# Patient Record
Sex: Female | Born: 1980 | Race: White | Hispanic: No | Marital: Married | Smoking: Current every day smoker
Health system: Southern US, Community
[De-identification: ages and names within clinical notes are randomized; demographics above are authoritative.]

## PROBLEM LIST (undated history)

## (undated) DIAGNOSIS — I639 Cerebral infarction, unspecified: Secondary | ICD-10-CM

## (undated) DIAGNOSIS — I10 Essential (primary) hypertension: Secondary | ICD-10-CM

---

## 2011-06-15 HISTORY — PX: CHOLECYSTECTOMY: SHX55

## 2012-06-14 DIAGNOSIS — I639 Cerebral infarction, unspecified: Secondary | ICD-10-CM

## 2012-06-14 HISTORY — DX: Cerebral infarction, unspecified: I63.9

## 2016-10-17 ENCOUNTER — Emergency Department (HOSPITAL_COMMUNITY)
Admission: EM | Admit: 2016-10-17 | Discharge: 2016-10-17 | Disposition: A | Discharge status: Home / Self Care | Payer: BLUE CROSS/BLUE SHIELD | Attending: Emergency Medicine | Admitting: Emergency Medicine

## 2016-10-17 ENCOUNTER — Encounter (HOSPITAL_COMMUNITY): Payer: Self-pay | Admitting: *Deleted

## 2016-10-17 ENCOUNTER — Emergency Department (HOSPITAL_COMMUNITY): Payer: BLUE CROSS/BLUE SHIELD

## 2016-10-17 ENCOUNTER — Encounter (HOSPITAL_COMMUNITY): Payer: Self-pay | Admitting: Emergency Medicine

## 2016-10-17 DIAGNOSIS — Z8673 Personal history of transient ischemic attack (TIA), and cerebral infarction without residual deficits: Secondary | ICD-10-CM | POA: Diagnosis not present

## 2016-10-17 DIAGNOSIS — Y939 Activity, unspecified: Secondary | ICD-10-CM | POA: Insufficient documentation

## 2016-10-17 DIAGNOSIS — R51 Headache: Secondary | ICD-10-CM | POA: Diagnosis not present

## 2016-10-17 DIAGNOSIS — F17210 Nicotine dependence, cigarettes, uncomplicated: Secondary | ICD-10-CM | POA: Insufficient documentation

## 2016-10-17 DIAGNOSIS — S3991XA Unspecified injury of abdomen, initial encounter: Secondary | ICD-10-CM | POA: Diagnosis not present

## 2016-10-17 DIAGNOSIS — Y999 Unspecified external cause status: Secondary | ICD-10-CM | POA: Diagnosis not present

## 2016-10-17 DIAGNOSIS — Y92410 Unspecified street and highway as the place of occurrence of the external cause: Secondary | ICD-10-CM | POA: Diagnosis not present

## 2016-10-17 DIAGNOSIS — N83202 Unspecified ovarian cyst, left side: Secondary | ICD-10-CM | POA: Insufficient documentation

## 2016-10-17 DIAGNOSIS — R079 Chest pain, unspecified: Secondary | ICD-10-CM | POA: Insufficient documentation

## 2016-10-17 DIAGNOSIS — I10 Essential (primary) hypertension: Secondary | ICD-10-CM | POA: Insufficient documentation

## 2016-10-17 HISTORY — DX: Essential (primary) hypertension: I10

## 2016-10-17 HISTORY — DX: Cerebral infarction, unspecified: I63.9

## 2016-10-17 LAB — I-STAT CHEM 8, ED
BUN: 7 mg/dL (ref 6–20)
CHLORIDE: 106 mmol/L (ref 101–111)
Calcium, Ion: 1.1 mmol/L — ABNORMAL LOW (ref 1.15–1.40)
Creatinine, Ser: 0.9 mg/dL (ref 0.44–1.00)
Glucose, Bld: 106 mg/dL — ABNORMAL HIGH (ref 65–99)
HCT: 37 % (ref 36.0–46.0)
Hemoglobin: 12.6 g/dL (ref 12.0–15.0)
POTASSIUM: 3.3 mmol/L — AB (ref 3.5–5.1)
SODIUM: 141 mmol/L (ref 135–145)
TCO2: 18 mmol/L (ref 0–100)

## 2016-10-17 LAB — I-STAT BETA HCG BLOOD, ED (MC, WL, AP ONLY): I-stat hCG, quantitative: <5 m[IU]/mL (ref <5)

## 2016-10-17 MED ORDER — IBUPROFEN 800 MG PO TABS: 800.0000 mg | ORAL_TABLET | Freq: Three times a day (TID) | ORAL | 0 refills | Status: AC | PRN

## 2016-10-17 MED ORDER — IOPAMIDOL (ISOVUE-300) INJECTION 61%
INTRAVENOUS | Status: AC
  Administered 2016-10-17: 100 mL
  Filled 2016-10-17: qty 100

## 2016-10-17 MED ORDER — SODIUM CHLORIDE 0.9 % IV BOLUS (SEPSIS)
1000.0000 mL | Freq: Once | INTRAVENOUS | Status: AC
  Administered 2016-10-17: 1000 mL via INTRAVENOUS
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
## 2016-10-17 NOTE — ED Notes (Signed)
Patient transported to CT 

## 2016-10-17 NOTE — Discharge Instructions (Signed)
To find a primary care or specialty doctor please call [phone] or [phone] to access "[facility] Find a Doctor Service." ° °You may also go on the [facility] website at [email or web address] ° °There are also multiple [facility], [facility] practices throughout [place] that are frequently accepting new patients. You may find a clinic that is close to your home and contact them. ° °[facility] -  °[address] °[place] [address] °[phone] ° ° °[facility] -  °[address] °[place] [address] °[phone] ° ° °[facility] - °[address]  °[place] [address] [phone] ° ° ° ° ° °[facility] °[email or web address] °[address] °[place], [place] °[phone]  ° ° °[facility] °[email or web address] °[address] °[place], [place] °[phone]  ° ° °[facility] °[address] °[place], [place] °[phone]  ° °[facility] °[email or web address] °[address] °[place], [place] °[phone]  ° °[facility] °[email or web address] °[address] °[place], [place] °[phone]  ° °[facility] °[email or web address] °[address] °, [place] °[phone] ° °
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
